# Patient Record
Sex: Male | Born: 1950 | Race: Black or African American | Hispanic: No | Smoking: Never smoker
Health system: Southern US, Community
[De-identification: ages and names within clinical notes are randomized; demographics above are authoritative.]

## PROBLEM LIST (undated history)

## (undated) DIAGNOSIS — E119 Type 2 diabetes mellitus without complications: Secondary | ICD-10-CM

## (undated) DIAGNOSIS — C801 Malignant (primary) neoplasm, unspecified: Secondary | ICD-10-CM

---

## 2021-01-10 ENCOUNTER — Emergency Department (HOSPITAL_BASED_OUTPATIENT_CLINIC_OR_DEPARTMENT_OTHER)
Admission: EM | Admit: 2021-01-10 | Discharge: 2021-01-10 | Disposition: A | Discharge status: Home / Self Care | Payer: Medicare Other | Attending: Emergency Medicine | Admitting: Emergency Medicine

## 2021-01-10 ENCOUNTER — Encounter (HOSPITAL_BASED_OUTPATIENT_CLINIC_OR_DEPARTMENT_OTHER): Payer: Self-pay | Admitting: Urology

## 2021-01-10 ENCOUNTER — Emergency Department (HOSPITAL_BASED_OUTPATIENT_CLINIC_OR_DEPARTMENT_OTHER): Payer: Medicare Other

## 2021-01-10 ENCOUNTER — Other Ambulatory Visit: Payer: Self-pay

## 2021-01-10 DIAGNOSIS — I10 Essential (primary) hypertension: Secondary | ICD-10-CM | POA: Insufficient documentation

## 2021-01-10 DIAGNOSIS — R109 Unspecified abdominal pain: Secondary | ICD-10-CM | POA: Insufficient documentation

## 2021-01-10 DIAGNOSIS — E119 Type 2 diabetes mellitus without complications: Secondary | ICD-10-CM | POA: Diagnosis not present

## 2021-01-10 DIAGNOSIS — Z79899 Other long term (current) drug therapy: Secondary | ICD-10-CM | POA: Insufficient documentation

## 2021-01-10 DIAGNOSIS — R197 Diarrhea, unspecified: Secondary | ICD-10-CM | POA: Insufficient documentation

## 2021-01-10 DIAGNOSIS — H5711 Ocular pain, right eye: Secondary | ICD-10-CM | POA: Insufficient documentation

## 2021-01-10 DIAGNOSIS — J449 Chronic obstructive pulmonary disease, unspecified: Secondary | ICD-10-CM | POA: Insufficient documentation

## 2021-01-10 DIAGNOSIS — B029 Zoster without complications: Secondary | ICD-10-CM | POA: Diagnosis not present

## 2021-01-10 DIAGNOSIS — I480 Paroxysmal atrial fibrillation: Secondary | ICD-10-CM | POA: Diagnosis not present

## 2021-01-10 DIAGNOSIS — B0230 Zoster ocular disease, unspecified: Secondary | ICD-10-CM

## 2021-01-10 DIAGNOSIS — Z853 Personal history of malignant neoplasm of breast: Secondary | ICD-10-CM | POA: Diagnosis not present

## 2021-01-10 DIAGNOSIS — R55 Syncope and collapse: Secondary | ICD-10-CM | POA: Diagnosis present

## 2021-01-10 HISTORY — DX: Malignant (primary) neoplasm, unspecified: C80.1

## 2021-01-10 HISTORY — DX: Type 2 diabetes mellitus without complications: E11.9

## 2021-01-10 LAB — T4, FREE: Free T4: 0.82 ng/dL (ref 0.61–1.12)

## 2021-01-10 LAB — CBC WITH DIFFERENTIAL/PLATELET
Abs Immature Granulocytes: 0.03 10*3/uL (ref 0.00–0.07)
Basophils Absolute: 0 10*3/uL (ref 0.0–0.1)
Basophils Relative: 0 %
Eosinophils Absolute: 0 10*3/uL (ref 0.0–0.5)
Eosinophils Relative: 0 %
HCT: 40.9 % (ref 39.0–52.0)
Hemoglobin: 13.3 g/dL (ref 13.0–17.0)
Immature Granulocytes: 0 %
Lymphocytes Relative: 23 %
Lymphs Abs: 2.2 10*3/uL (ref 0.7–4.0)
MCH: 27.5 pg (ref 26.0–34.0)
MCHC: 32.5 g/dL (ref 30.0–36.0)
MCV: 84.7 fL (ref 80.0–100.0)
Monocytes Absolute: 0.2 10*3/uL (ref 0.1–1.0)
Monocytes Relative: 2 %
Neutro Abs: 6.8 10*3/uL (ref 1.7–7.7)
Neutrophils Relative %: 75 %
Platelets: 457 10*3/uL — ABNORMAL HIGH (ref 150–400)
RBC: 4.83 MIL/uL (ref 4.22–5.81)
RDW: 16.2 % — ABNORMAL HIGH (ref 11.5–15.5)
WBC: 9.2 10*3/uL (ref 4.0–10.5)
nRBC: 0.2 % (ref 0.0–0.2)

## 2021-01-10 LAB — COMPREHENSIVE METABOLIC PANEL
ALT: 17 U/L (ref 0–44)
AST: 20 U/L (ref 15–41)
Albumin: 3.5 g/dL (ref 3.5–5.0)
Alkaline Phosphatase: 66 U/L (ref 38–126)
Anion gap: 12 (ref 5–15)
BUN: 20 mg/dL (ref 8–23)
CO2: 16 mmol/L — ABNORMAL LOW (ref 22–32)
Calcium: 7.7 mg/dL — ABNORMAL LOW (ref 8.9–10.3)
Chloride: 115 mmol/L — ABNORMAL HIGH (ref 98–111)
Creatinine, Ser: 1.21 mg/dL (ref 0.61–1.24)
GFR, Estimated: >60 mL/min (ref >60)
Glucose, Bld: 179 mg/dL — ABNORMAL HIGH (ref 70–99)
Potassium: 3.6 mmol/L (ref 3.5–5.1)
Sodium: 143 mmol/L (ref 135–145)
Total Bilirubin: 1.1 mg/dL (ref 0.3–1.2)
Total Protein: 6.1 g/dL — ABNORMAL LOW (ref 6.5–8.1)

## 2021-01-10 LAB — LIPASE, BLOOD: Lipase: 32 U/L (ref 11–51)

## 2021-01-10 LAB — TSH: TSH: 0.523 u[IU]/mL (ref 0.350–4.500)

## 2021-01-10 LAB — TROPONIN I (HIGH SENSITIVITY)
Troponin I (High Sensitivity): 8 ng/L (ref <18)
Troponin I (High Sensitivity): 5 ng/L (ref <18)

## 2021-01-10 LAB — CBG MONITORING, ED: Glucose-Capillary: 180 mg/dL — ABNORMAL HIGH (ref 70–99)

## 2021-01-10 MED ORDER — IOHEXOL 350 MG/ML SOLN
100.0000 mL | Freq: Once | INTRAVENOUS | Status: AC | PRN
  Administered 2021-01-10: 100 mL via INTRAVENOUS

## 2021-01-10 MED ORDER — METOPROLOL TARTRATE 5 MG/5ML IV SOLN
5.0000 mg | Freq: Once | INTRAVENOUS | Status: AC
  Administered 2021-01-10: 5 mg via INTRAVENOUS

## 2021-01-10 MED ORDER — ERYTHROMYCIN 5 MG/GM OP OINT: TOPICAL_OINTMENT | OPHTHALMIC | 0 refills | Status: AC

## 2021-01-10 MED ORDER — FLUORESCEIN SODIUM 1 MG OP STRP
1.0000 | ORAL_STRIP | Freq: Once | OPHTHALMIC | Status: AC
  Administered 2021-01-10: 1 via OPHTHALMIC
  Filled 2021-01-10: qty 1

## 2021-01-10 MED ORDER — ERYTHROMYCIN 5 MG/GM OP OINT
TOPICAL_OINTMENT | Freq: Four times a day (QID) | OPHTHALMIC | Status: DC
  Administered 2021-01-10: 1 via OPHTHALMIC
  Filled 2021-01-10: qty 3.5

## 2021-01-10 MED ORDER — LACTATED RINGERS IV BOLUS
1000.0000 mL | Freq: Once | INTRAVENOUS | Status: AC
  Administered 2021-01-10: 1000 mL via INTRAVENOUS

## 2021-01-10 MED ORDER — TETRACAINE HCL 0.5 % OP SOLN
2.0000 [drp] | Freq: Once | OPHTHALMIC | Status: AC
  Administered 2021-01-10: 2 [drp] via OPHTHALMIC
  Filled 2021-01-10: qty 4

## 2021-01-10 MED ORDER — VALACYCLOVIR HCL 1 G PO TABS
1000.0000 mg | ORAL_TABLET | Freq: Three times a day (TID) | ORAL | 0 refills | Status: AC
Start: 2021-01-10 — End: 2021-01-20

## 2021-01-10 NOTE — ED Notes (Signed)
Patient transported to CT 

## 2021-01-10 NOTE — Discharge Instructions (Addendum)
Continue to take valacyclovir for another 10 days.  Continue to take eye ointment 4 times a day and administer as nursing staff showed you today.  Call your ophthalmologist as they will be trying to evaluate you on Monday.  Follow-up in our atrial fibrillation clinic with cardiology.  Continue to take your Cardizem.  If you are having worsening palpitations, chest pain or shortness of breath please return to the emergency department for evaluation.

## 2021-01-10 NOTE — ED Notes (Signed)
ED Provider at bedside. Dr. Billy Fischer

## 2021-01-10 NOTE — ED Triage Notes (Signed)
Pt states rash with blisters to right side scalp and eye for 2 weeks, rash and blisters healing, but states right eye irritated and draining.  No redness or swelling noted at this time.    Pt currently on Chemo, BP LOW at triage

## 2021-01-10 NOTE — ED Provider Notes (Signed)
Patient signed out to me at 4 PM.  Here for reevaluation of his known herpes zoster infection to his right eye.  He has been having some diarrhea today as well.  Had a vasovagal event after using the bathroom here but did not lose consciousness or hit his head.  He has had extensive work-up that is overall unremarkable.  He is feeling better after IV fluids.  Lab work shows no significant anemia or electrolyte abnormality.  Troponin negative x2.  Bicarb is 16 but likely in the setting of GI losses.  Anion gap is normal.  He is not having any nausea or vomiting currently.  He is undergoing chemotherapy for prostate cancer metastasis treatment.  He states he has a history of atrial fibrillation/atrial flutter and takes Cardizem.  He is not on a blood thinner due to bleeding risk.  He understands the risks and benefits of not taking a blood thinner.  He is still not interested in taking blood thinner.  We will have him follow-up in atrial fibrillation clinic.  Heart rate was mildly elevated in the low 100s with atrial fibrillation rate but he was given Lopressor with good improvement of heart rate in the 90s.  He is asymptomatic from the standpoint.  Referred him to atrial fibrillation clinic.  He understands return to the ED if he has worsening palpitations, chest pain, shortness of breath.  Overall suspect diarrhea likely secondary to side effect of his antiviral or could be a viral process.  CT scan shows no evidence of PE or infectious process within the chest of the abdomen.  Discharged in good condition.  He is provider did get in touch with his ophthalmologist who recommend that he start erythromycin eye ointment and will continue valacyclovir.  They will follow-up with him on Monday.  This chart was dictated using voice recognition software.  Despite best efforts to proofread,  errors can occur which can change the documentation meaning.    Lennice Sites, DO 01/10/21 1713

## 2021-01-10 NOTE — ED Notes (Addendum)
Pt taken to Room 7 to complete triage due to low BP and ?near syncope. EDPA Harris at bedside to assess during triage. Pt more alert upon arrival to room. CAO x 4 at present

## 2021-01-10 NOTE — ED Notes (Signed)
ED Provider at bedside. Dr. Curatolo 

## 2021-01-10 NOTE — ED Notes (Signed)
ED Provider at bedside. 

## 2021-01-10 NOTE — ED Triage Notes (Signed)
Pt states feeling weak since this morning

## 2021-01-10 NOTE — ED Provider Notes (Signed)
[facility] EMERGENCY DEPARTMENT Provider Note   CSN: [id number] Arrival date & time: 01/10/21  1316     History Chief Complaint  Patient presents with   Eye Problem   Weakness    Robert Hammond is a 70 y.o. male.  HPI     70yo male with history of prostate cancer with bone metastasis, anemia, COPD, left breast cancer, hypertension, hyperlipidemia, PVD, herpes zoster opthalmicus of right eye/shingles 11/4, who presents with concern for continued pain over site and continued eye pain, however this morning also noted 7 episodes of diarrhea and had a near syncopal episode after triage in the ED and was found to be in new atrial fibrillation with RVR.   7 episodes of diarrhea since waking up this AM, runny, not black or bloody Came intoday because shingles and right side of head aching, numb and tingling, see if he could get more medication, believes in eye. Saw eye doctor in ED and was given valacyclvir, still finishing it, took erythromycin ointment, but rubbed it on face instead and ran out. Went into the bathroom at triage, was going to have a BM but then felt lightheaded and almost passed out, nausea  Felt like this before a few days after starting chemo about 1.5 months ago.  No chest pain or dyspnea No urinary symptoms, cough, fever   Right eye aches, vision not blurred, feels ok at baseline Right eye pain getting worse     Past Medical History:  Diagnosis Date   Cancer ([place])    Diabetes mellitus without complication ([place])     There are no problems to display for this patient.   History reviewed. No pertinent surgical history.     History reviewed. No pertinent family history.  Social History   Tobacco Use   Smoking status: Never   Smokeless tobacco: Never  Substance Use Topics   Alcohol use: Never   Drug use: Never    Home Medications Prior to Admission medications   Medication Sig Start Date End Date Taking? Authorizing Provider   abiraterone acetate (ZYTIGA) 250 MG tablet Take by mouth. 03/09/16  Yes [provider]  amLODipine (NORVASC) 10 MG tablet Take by mouth. 05/08/10  Yes [provider]  atorvastatin (LIPITOR) 20 MG tablet Take by mouth. 11/22/10  Yes [provider]  diltiazem (CARDIZEM) 30 MG tablet Take by mouth. 03/24/12  Yes [provider]  erythromycin ophthalmic ointment Place a 1/2 inch ribbon of ointment into the lower eyelid four times a day. 01/10/21  Yes Gareth Morgan, MD  losartan (COZAAR) 25 MG tablet Take by mouth. 12/07/19  Yes [provider]  magnesium oxide (MAG-OX) 400 MG tablet TAKE 1 TABLET BY MOUTH TWICE DAILY FOR 30 DAYS 12/16/17  Yes [provider]  naproxen (NAPROSYN) 500 MG tablet Take 1 tablet by mouth 2 (two) times daily with a meal. 03/09/18  Yes [provider]  omeprazole (PRILOSEC) 20 MG capsule Take by mouth. 08/06/10  Yes [provider]  oxybutynin (DITROPAN-XL) 10 MG 24 hr tablet Take 1 tablet by mouth daily. 08/25/11  Yes [provider]  potassium chloride (KLOR-CON) 10 MEQ tablet Take 1 tablet by mouth daily. 02/12/12  Yes [provider]  predniSONE (DELTASONE) 5 MG tablet Take 1 tablet by mouth daily. 03/09/16  Yes [provider]  prochlorperazine (COMPAZINE) 10 MG tablet Take by mouth. 09/15/20  Yes [provider]  triamterene-hydrochlorothiazide (MAXZIDE-25) 37.5-25 MG tablet Take 1 tablet by mouth daily.  02/12/12  Yes [provider]  HYDROcodone-acetaminophen (NORCO) 10-325 MG tablet Take 1 tablet by mouth 2 (two) times daily. 11/24/20   [provider]  mirtazapine (REMERON) 15 MG tablet Take 15 mg by mouth at bedtime. 11/24/20   [provider]  sertraline (ZOLOFT) 25 MG tablet Take 25 mg by mouth daily. 11/17/20   [provider]  valACYclovir (VALTREX) 1000 MG tablet Take 1 tablet (1,000 mg total) by mouth 3 (three) times daily  for 10 days. 01/10/21 01/20/21  Gareth Morgan, MD    Allergies    Patient has no known allergies.  Review of Systems   Review of Systems  Constitutional:  Negative for fever.  HENT:  Negative for sore throat.   Eyes:  Positive for pain. Negative for visual disturbance.  Respiratory:  Negative for cough and shortness of breath.   Cardiovascular:  Negative for chest pain.  Gastrointestinal:  Positive for abdominal pain and diarrhea. Negative for nausea and vomiting.  Genitourinary:  Negative for difficulty urinating.  Musculoskeletal:  Negative for back pain and neck stiffness.  Skin:  Positive for rash.  Neurological:  Positive for light-headedness. Negative for syncope, speech difficulty, weakness, numbness and headaches.   Physical Exam Updated Vital Signs BP (!) 147/92 (BP Location: Left Arm)   Pulse (!) 105   Temp 98.3 F (36.8 C) (Oral)   Resp 15   Ht 5\' 7"  (1.702 m)   Wt 60.8 kg   SpO2 100%   BMI 20.99 kg/m   Physical Exam Vitals and nursing note reviewed.  Constitutional:      General: He is not in acute distress.    Appearance: He is well-developed. He is not diaphoretic.  HENT:     Head: Normocephalic and atraumatic.  Eyes:     Conjunctiva/sclera: Conjunctivae normal.     Comments: Iop 13 on right, mild conjunctival ingection, possible tiny area of fluorescein uptake with woods lamp seen, normal EOM, normal pupils  Cardiovascular:     Rate and Rhythm: Tachycardia present. Rhythm irregular.     Heart sounds: Normal heart sounds. No murmur heard.   No friction rub. No gallop.  Pulmonary:     Effort: Pulmonary effort is normal. No respiratory distress.     Breath sounds: Normal breath sounds. No wheezing or rales.  Abdominal:     General: There is no distension.     Palpations: Abdomen is soft.     Tenderness: There is no abdominal tenderness. There is no guarding.  Musculoskeletal:     Cervical back: Normal range of motion.  Skin:    General: Skin is  warm and dry.  Neurological:     Mental Status: He is alert and oriented to person, place, and time.    ED Results / Procedures / Treatments   Labs (all labs ordered are listed, but only abnormal results are displayed) Labs Reviewed  CBC WITH DIFFERENTIAL/PLATELET - Abnormal; Notable for the following components:      Result Value   RDW 16.2 (*)    Platelets 457 (*)    All other components within normal limits  COMPREHENSIVE METABOLIC PANEL - Abnormal; Notable for the following components:   Chloride 115 (*)    CO2 16 (*)    Glucose, Bld 179 (*)    Calcium 7.7 (*)    Total Protein 6.1 (*)    All other components within normal limits  CBG MONITORING, ED - Abnormal; Notable for the following components:   Glucose-Capillary  180 (*)    All other components within normal limits  LIPASE, BLOOD  TSH  T4, FREE  TROPONIN I (HIGH SENSITIVITY)  TROPONIN I (HIGH SENSITIVITY)    EKG EKG Interpretation  Date/Time:  Saturday January 10 2021 13:32:22 EST Ventricular Rate:  123 PR Interval:    QRS Duration: 90 QT Interval:  353 QTC Calculation: 505 R Axis:   5 Text Interpretation: Atrial fibrillation LVH with secondary repolarization abnormality Prolonged QT interval No previous ECGs available Confirmed by Gareth Morgan [phone]) on 01/10/2021 2:45:15 PM  Radiology CT Angio Chest PE W and/or Wo Contrast  Result Date: 01/10/2021 CLINICAL DATA:  Near syncope, atrial fibrillation EXAM: CT ANGIOGRAPHY CHEST WITH CONTRAST TECHNIQUE: Multidetector CT imaging of the chest was performed using the standard protocol during bolus administration of intravenous contrast. Multiplanar CT image reconstructions and MIPs were obtained to evaluate the vascular anatomy. CONTRAST:  121mL OMNIPAQUE IOHEXOL 350 MG/ML SOLN COMPARISON:  None. FINDINGS: Cardiovascular: Heart is enlarged in size. There is homogeneous enhancement in the thoracic aorta. Ascending thoracic aorta measures 4 cm. There are no  intraluminal filling defects in pulmonary artery branches. There is ectasia of main pulmonary artery measuring 3.8 cm. Mediastinum/Nodes: No significant lymphadenopathy seen Lungs/Pleura: There is no focal pulmonary consolidation. There is no pleural effusion or pneumothorax. Upper Abdomen: There is bilateral hydronephrosis. Bilateral ureteral stents are seen. Musculoskeletal: Is deformity in inferior aspect of manubrium sternum. There is slight decrease in height of few thoracic vertebral bodies. There is decrease in height of upper endplate of body of T2 vertebra. There is mild central compression in the bodies of T6, T7, T8, T9 and T10 vertebrae. Degenerative changes are noted in the visualized lower cervical spine with bony spurs. Review of the MIP images confirms the above findings. IMPRESSION: There is no evidence of pulmonary artery embolism. There is no evidence of thoracic aortic dissection. There is no focal pulmonary consolidation. There is ectasia of main pulmonary artery suggesting possible pulmonary arterial hypertension. There is ectasia of ascending thoracic aorta. There is deformity in the inferior aspect of manubrium sternum suggesting recent or old fracture. There is decrease in height of multiple thoracic vertebral bodies suggesting recent or old fractures. Electronically Signed   By: Elmer Picker M.D.   On: 01/10/2021 16:42   CT ABDOMEN PELVIS W CONTRAST  Result Date: 01/10/2021 CLINICAL DATA:  Abdominal pain, diarrhea, metastatic prostate cancer, history of left breast cancer EXAM: CT ABDOMEN AND PELVIS WITH CONTRAST TECHNIQUE: Multidetector CT imaging of the abdomen and pelvis was performed using the standard protocol following bolus administration of intravenous contrast. CONTRAST:  147mL OMNIPAQUE IOHEXOL 350 MG/ML SOLN COMPARISON:  None. FINDINGS: Lower chest: No acute pleural or parenchymal lung disease. Hepatobiliary: No focal liver abnormality is seen. No gallstones,  gallbladder wall thickening, or biliary dilatation. Pancreas: Unremarkable. No pancreatic ductal dilatation or surrounding inflammatory changes. Spleen: Normal in size without focal abnormality. Adrenals/Urinary Tract: Bilateral ureteral stents are identified extending from the renal pelves to the bladder lumen. There is mild bilateral hydronephrosis and hydroureter. There is bilateral renal cortical scarring and thinning. Small renal cortical cysts are also noted. Mild bilateral ureteral wall thickening. The bladder is decompressed, with nonspecific bladder wall thickening. The adrenals are unremarkable. Stomach/Bowel: No bowel obstruction or ileus. Normal appendix right lower quadrant. No bowel wall thickening or inflammatory change. Vascular/Lymphatic: Mild atherosclerosis of the aorta and its distal branches. There is chronic occlusion of the right external iliac artery. A femoral-femoral bypass graft is seen within  the lower anterior abdominal wall, which is occluded. The right common femoral artery reconstitutes at the level of the anastomosis with the occluded femoral-femoral bypass graft, likely due to internal iliac collaterals. 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 The left superficial and profundus femoral arteries are patent. No pathologic adenopathy within the abdomen or pelvis. Reproductive: The prostate appears surgically absent. Other: No free fluid or free gas.  No abdominal wall hernia. Musculoskeletal: Mixed lytic and sclerotic focus within the left iliac crest consistent with metastatic disease. Primarily sclerotic lesions within the left inferior pubic ramus, right L1 pedicle and left posterior ninth rib also compatible with bony metastases. If further evaluation is desired, bone scan could be considered. There are no acute displaced fractures. Reconstructed images demonstrate no  additional findings. IMPRESSION: 1. No acute intra-abdominal or intrapelvic process to account for the patient's complaint of abdominal pain and diarrhea. 2. Bony metastases within the thoracic cage, lumbar spine, and pelvis consistent with metastatic prostate cancer per history. 3. Bilateral hydronephrosis and hydroureter, with indwelling ureteral stents as above. 4. 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 Occluded femoral-femoral bypass graft as above. 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 5.  Aortic Atherosclerosis (ICD10-I70.0). Electronically Signed   By: Randa Ngo M.D.   On: 01/10/2021 16:45    Procedures Procedures   Medications Ordered in ED Medications  fluorescein ophthalmic strip 1 strip (1 strip Both Eyes Given by Other 01/10/21 1431)  tetracaine (PONTOCAINE) 0.5 % ophthalmic solution 2 drop (2 drops Both Eyes Given by Other 01/10/21 1430)  lactated ringers bolus 1,000 mL (0 mLs Intravenous Stopped 01/10/21 1605)  metoprolol tartrate (LOPRESSOR) injection 5 mg (5 mg Intravenous Given 01/10/21 1610)  iohexol (OMNIPAQUE) 350 MG/ML injection 100 mL (100 mLs Intravenous Contrast Given 01/10/21 1535)    ED Course  I have reviewed the triage vital signs and the nursing notes.  Pertinent labs & imaging results that were available during my care of the patient were reviewed by me and considered in my medical decision making (see chart for details). 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  Eye pain/facial pain--possible post-herpetic neuralgia, versus continued herpes zoster. Denies visual changes. Normal IOP/possible tiny area of fluorescein uptake. Discussed with Op[facility]o at [facility] especially given he had not done drops/erythromycin as prescribed previously.  Recommend erythromycin as well as valacyclovir treatment again and follow up in their office. Near-Syncope--no sign of significant anemia, do not suspect GI bleed. Possible dehydration related with many episodes of diarrhea today, labs show nonaniongap metabolic acidosis consistent with this, given IV fluid.   Suspect most likely vagal incident given at time was about to have BM, quickly recovered blood pressures.  Consider PE however given new onset atrial fibrillation and cancer history or other intraabdominal infection. Lower suspicion for cardiac arrythmia other than atrial fibrillation at this time--offered admission to patient for near-syncope observation but he would prefer to go home and would like to reassess after other results back New onset atrial fibrillation-sent TSH, PE study pending. Reports he has had significant hematuria in the past and obstruction and would rather risk stroke than be initiated on anticoagulation given problems with this before. He is on diltiazem and has cardiologist associated with [facility] in [place]. Given IV metoprolol given fluctuating HR in ED up to 120s.   Signed out to Dr. Ronnald Nian with PE study pending to evaluate new onset atrial fibrillation and near-syncope in high risk patient with cancer, and CT abdomen pelvis pending given diarrhea, abdominal tenderness on exam.   Final Clinical Impression(s) / ED Diagnoses Final diagnoses:  Ophthalmic herpes zoster  Paroxysmal atrial fibrillation ([place])    Rx / DC Orders ED Discharge Orders           Ordered    erythromycin ophthalmic ointment        01/10/21 1532    valACYclovir (VALTREX) 1000 MG tablet  3 times daily        01/10/21 1534    Amb Referral to AFIB Clinic        01/10/21 [place], [place], MD 01/11/21 [phone]

## 2021-01-12 ENCOUNTER — Telehealth (HOSPITAL_COMMUNITY): Payer: Self-pay

## 2021-01-12 NOTE — Telephone Encounter (Signed)
Reached out to patient to schedule ED follow-up. Tried to leave voicemail mailbox full.

## 2021-12-14 ENCOUNTER — Other Ambulatory Visit (HOSPITAL_COMMUNITY): Payer: Self-pay | Admitting: Hematology & Oncology

## 2021-12-14 DIAGNOSIS — C7951 Secondary malignant neoplasm of bone: Secondary | ICD-10-CM

## 2021-12-21 ENCOUNTER — Ambulatory Visit (HOSPITAL_COMMUNITY): Admission: RE | Admit: 2021-12-21 | Payer: Medicare Other | Source: Ambulatory Visit

## 2021-12-31 ENCOUNTER — Encounter (HOSPITAL_COMMUNITY): Payer: Self-pay

## 2021-12-31 ENCOUNTER — Ambulatory Visit (HOSPITAL_COMMUNITY)
Admission: RE | Admit: 2021-12-31 | Discharge: 2021-12-31 | Disposition: A | Discharge status: Home / Self Care | Payer: Medicare Other | Source: Ambulatory Visit | Attending: Hematology & Oncology | Admitting: Hematology & Oncology

## 2021-12-31 DIAGNOSIS — C7951 Secondary malignant neoplasm of bone: Secondary | ICD-10-CM

## 2022-01-07 ENCOUNTER — Other Ambulatory Visit (HOSPITAL_COMMUNITY): Payer: Self-pay | Admitting: Hematology & Oncology

## 2022-01-07 DIAGNOSIS — C61 Malignant neoplasm of prostate: Secondary | ICD-10-CM

## 2022-01-11 ENCOUNTER — Other Ambulatory Visit: Payer: Self-pay | Admitting: *Deleted

## 2022-01-11 DIAGNOSIS — C61 Malignant neoplasm of prostate: Secondary | ICD-10-CM

## 2022-01-19 ENCOUNTER — Telehealth: Payer: Self-pay | Admitting: Oncology

## 2022-01-19 NOTE — Telephone Encounter (Signed)
Per  staff message called and spoke to pt about appointment

## 2022-01-25 ENCOUNTER — Inpatient Hospital Stay: Payer: Medicare Other | Attending: Internal Medicine

## 2022-02-01 ENCOUNTER — Encounter (HOSPITAL_COMMUNITY)
Admission: RE | Admit: 2022-02-01 | Discharge: 2022-02-01 | Disposition: A | Discharge status: Home / Self Care | Payer: Medicare Other | Source: Ambulatory Visit | Attending: Hematology & Oncology | Admitting: Hematology & Oncology

## 2022-02-01 VITALS — BP 128/85 | HR 76 | Resp 18

## 2022-02-01 DIAGNOSIS — C61 Malignant neoplasm of prostate: Secondary | ICD-10-CM | POA: Diagnosis present

## 2022-02-01 LAB — BASIC METABOLIC PANEL
Anion gap: 8 (ref 5–15)
BUN: 13 mg/dL (ref 8–23)
CO2: 21 mmol/L — ABNORMAL LOW (ref 22–32)
Calcium: 8.4 mg/dL — ABNORMAL LOW (ref 8.9–10.3)
Chloride: 112 mmol/L — ABNORMAL HIGH (ref 98–111)
Creatinine, Ser: 1.37 mg/dL — ABNORMAL HIGH (ref 0.61–1.24)
GFR, Estimated: 55 mL/min — ABNORMAL LOW (ref >60)
Glucose, Bld: 128 mg/dL — ABNORMAL HIGH (ref 70–99)
Potassium: 4.6 mmol/L (ref 3.5–5.1)
Sodium: 141 mmol/L (ref 135–145)

## 2022-02-01 MED ORDER — SODIUM CHLORIDE 0.9 % IV BOLUS: 1000.0000 mL | Freq: Once | INTRAVENOUS | Status: DC

## 2022-02-01 MED ORDER — LUTETIUM LU 177 VIPIVOTIDE TET 1000 MBQ/ML IV SOLN
200.0000 | Freq: Once | INTRAVENOUS | Status: AC
  Administered 2022-02-01: 202.79 via INTRAVENOUS

## 2022-02-01 NOTE — Progress Notes (Signed)
CLINICAL DATA: [Castrate resistant metastatic prostate carcinoma.  Patient status post chemotherapy with increasing PSA.  Bilateral renal stents.]  EXAM: NUCLEAR MEDICINE PLUVICTO INJECTION  TECHNIQUE: Infusion: The nuclear medicine technologist and I personally verified the dose activity to be delivered as specified in the written directive, and verified the patient identification via 2 separate methods.  Initial flush of the intravenous catheter was performed was sterile saline. The dose syringe was connected to the catheter and the Lu-177 Pluvicto administered over a 1 to 10 min infusion. Single 10 cc  lushes with normal saline follow the dose. No complications were noted. The entire IV tubing, venocatheter, stopcock and syringes was removed in total, placed in a disposal bag and sent for assay of the residual activity, which will be reported at a later time in our EMR by the physics staff. Pressure was applied to the venipuncture site, and a compression bandage placed. Patient monitored for 1 hour following infusion.    Radiation Safety personnel were present to perform the discharge survey, as detailed on their documentation. After a short period of observation, the patient had his IV removed.  RADIOPHARMACEUTICALS: [202.8] microcuries Lu-177 PLUVICTO  FINDINGS: Current Infusion: [1]  Planned Infusions: 6    Patient presented to nuclear medicine for treatment. The patient's most recent blood counts were reviewed and remains a good candidate to proceed with Lu-177 Pluvicto.     Patient has bilateral ureteral stents.  Creatinine in within acceptable level (serum creatinine equal 1.37).  GFR equal 55.      Hemoglobin mildly depressed related to chemotherapy.       Risk and benefits of procedure described to the patient.  Primary risk being myelosuppression and renal toxicity.  Primary benefit being progression-free survival.     The patient was situated in an infusion suite  with a contact barrier placed under the arm. Intravenous access was established, using sterile technique, and a normal saline infusion from a syringe was started.     Micro-dosimetry: The prescribed radiation activity was assayed and confirmed to be within specified tolerance.  IMPRESSION: Current Infusion: [1]  Planned Infusions: 6    [The patient tolerated the infusion well. The patient will return in 6 weeks for for ongoing care.]    Patient to return to oncologist in 5 weeks for CBC and BMP.

## 2022-02-01 NOTE — Progress Notes (Signed)
Treatment is complete. Pt is alert, calm, and pleasant and has no s/s of distress. VS and orders assessed and pt denies additional complaints. Will continue to monitor and tx pt according to MD orders.

## 2022-02-01 NOTE — Written Directive (Addendum)
  PLUVICTO  THERAPY   RADIOPHARMACEUTICAL: Lutetium 177 vipivotide tetraxetan (Pluvicto)     PRESCRIBED DOSE FOR ADMINISTRATION:  200 mCi   ROUTE OFADMINISTRATION:  IV   DIAGNOSIS:  Prostate Cancer    REFERRING PHYSICIAN: Dr. Cruzita Lederer   TREATMENT #: 1   ADDITIONAL PHYSICIAN COMMENTS/NOTES:   AUTHORIZED USER SIGNATURE & TIME STAMP: Rennis Golden, MD   02/01/22    9:01 AM

## 2022-03-08 ENCOUNTER — Inpatient Hospital Stay: Payer: Medicaid Other | Attending: Internal Medicine

## 2022-03-12 NOTE — Written Directive (Signed)
  PLUVICTO  THERAPY   RADIOPHARMACEUTICAL: Lutetium 177 vipivotide tetraxetan (Pluvicto)     PRESCRIBED DOSE FOR ADMINISTRATION:  200 mCi   ROUTE OFADMINISTRATION:  IV   DIAGNOSIS:  Prostate Cancer    REFERRING PHYSICIAN: Dr Cruzita Lederer    TREATMENT #: 2    ADDITIONAL PHYSICIAN COMMENTS/NOTES:   AUTHORIZED USER SIGNATURE & TIME STAMP:

## 2022-03-15 ENCOUNTER — Ambulatory Visit (HOSPITAL_COMMUNITY)
Admission: RE | Admit: 2022-03-15 | Discharge: 2022-03-15 | Disposition: A | Discharge status: Home / Self Care | Payer: 59 | Source: Ambulatory Visit | Attending: Hematology & Oncology | Admitting: Hematology & Oncology

## 2022-03-15 DIAGNOSIS — C61 Malignant neoplasm of prostate: Secondary | ICD-10-CM | POA: Insufficient documentation

## 2022-03-15 MED ORDER — SODIUM CHLORIDE 0.9 % IV BOLUS: 1000.0000 mL | Freq: Once | INTRAVENOUS | Status: DC

## 2022-03-15 MED ORDER — LUTETIUM LU 177 VIPIVOTIDE TET 1000 MBQ/ML IV SOLN
200.0000 | Freq: Once | INTRAVENOUS | Status: AC
  Administered 2022-03-15: 209.9 via INTRAVENOUS

## 2022-03-15 NOTE — Written Directive (Signed)
MOLECULAR IMAGING AND THERAPEUTICS WRITTEN DIRECTIVE   PATIENT NAME: Robert Hammond  PT DOB:   October 03, 1950                                              MRN: [id number]  ---------------------------------------------------------------------------------------------------------------------   PLUVICTO  THERAPY   RADIOPHARMACEUTICAL: Lutetium 177 vipivotide tetraxetan (Pluvicto)     PRESCRIBED DOSE FOR ADMINISTRATION:  200 mCi   ROUTE OFADMINISTRATION:  IV   DIAGNOSIS:  Prostate Cancer   REFERRING PHYSICIAN: V.C. Harish   TREATMENT #: 2   ADDITIONAL PHYSICIAN COMMENTS/NOTES:   AUTHORIZED USER SIGNATURE & TIME STAMP:

## 2022-03-15 NOTE — Progress Notes (Signed)
Pluvicto treatment is complete and pt is alert, calm and pleasant and has no s/s of distress. VS and orders assessed and pt denies complaints. Will continue to monitor and tx pt according to MD orders. NS continues to infuse at this time.

## 2022-03-15 NOTE — Progress Notes (Signed)
CLINICAL DATA: [ Castrate resistant metastatic prostate carcinoma. Patient status post chemotherapy with increasing PSA. Bilateral renal stents. ]  EXAM: NUCLEAR MEDICINE PLUVICTO INJECTION  TECHNIQUE: Infusion: The nuclear medicine technologist and I personally verified the dose activity to be delivered as specified in the written directive, and verified the patient identification via 2 separate methods.  Initial flush of the intravenous catheter was performed was sterile saline. The dose syringe was connected to the catheter and the Lu-177 Pluvicto administered over a 1 to 10 min infusion. Single 10 cc  lushes with normal saline follow the dose. No complications were noted. The entire IV tubing, venocatheter, stopcock and syringes was removed in total, placed in a disposal bag and sent for assay of the residual activity, which will be reported at a later time in our EMR by the physics staff. Pressure was applied to the venipuncture site, and a compression bandage placed. Patient monitored for 1 hour following infusion.  Radiation Safety personnel were present to perform the discharge survey, as detailed on their documentation. After a short period of observation, the patient had his IV removed.  RADIOPHARMACEUTICALS: [209.9] microcuries Lu-177 PLUVICTO  FINDINGS: Current Infusion: [2]  Planned Infusions: 6  Patient presented to nuclear medicine for treatment. The patient's most recent blood counts were reviewed and remains a good candidate to proceed with Lu-177 Pluvicto.   Normal renal function hepatic function.  No significant decrease in blood counts.    Patient reports significant decrease in PSA (lab value  not available)    The patient was situated in an infusion suite with a contact barrier placed under the arm. Intravenous access was established, using sterile technique, and a normal saline infusion from a syringe was started.    Micro-dosimetry: The prescribed radiation  activity was assayed and confirmed to be within specified tolerance.  IMPRESSION: Current Infusion: [2]  Planned Infusions: 6  [The patient tolerated the infusion well. The patient will return in 6 weeks for ongoing care.]

## 2022-04-19 ENCOUNTER — Inpatient Hospital Stay: Payer: 59

## 2022-04-22 ENCOUNTER — Inpatient Hospital Stay: Payer: 59

## 2022-04-22 ENCOUNTER — Inpatient Hospital Stay: Payer: 59 | Attending: Internal Medicine

## 2022-04-22 ENCOUNTER — Other Ambulatory Visit: Payer: Self-pay

## 2022-04-22 DIAGNOSIS — Z79899 Other long term (current) drug therapy: Secondary | ICD-10-CM | POA: Diagnosis not present

## 2022-04-22 DIAGNOSIS — C61 Malignant neoplasm of prostate: Secondary | ICD-10-CM | POA: Insufficient documentation

## 2022-04-22 DIAGNOSIS — C7951 Secondary malignant neoplasm of bone: Secondary | ICD-10-CM | POA: Diagnosis not present

## 2022-04-22 LAB — CMP (CANCER CENTER ONLY)
ALT: 8 U/L (ref 0–44)
AST: 13 U/L — ABNORMAL LOW (ref 15–41)
Albumin: 3.7 g/dL (ref 3.5–5.0)
Alkaline Phosphatase: 76 U/L (ref 38–126)
Anion gap: 8 (ref 5–15)
BUN: 11 mg/dL (ref 8–23)
CO2: 23 mmol/L (ref 22–32)
Calcium: 8.3 mg/dL — ABNORMAL LOW (ref 8.9–10.3)
Chloride: 113 mmol/L — ABNORMAL HIGH (ref 98–111)
Creatinine: 0.97 mg/dL (ref 0.61–1.24)
GFR, Estimated: >60 mL/min (ref >60)
Glucose, Bld: 96 mg/dL (ref 70–99)
Potassium: 3.4 mmol/L — ABNORMAL LOW (ref 3.5–5.1)
Sodium: 144 mmol/L (ref 135–145)
Total Bilirubin: 0.3 mg/dL (ref 0.3–1.2)
Total Protein: 6.1 g/dL — ABNORMAL LOW (ref 6.5–8.1)

## 2022-04-22 LAB — CBC WITH DIFFERENTIAL (CANCER CENTER ONLY)
Abs Immature Granulocytes: 0 10*3/uL (ref 0.00–0.07)
Basophils Absolute: 0 10*3/uL (ref 0.0–0.1)
Basophils Relative: 1 %
Eosinophils Absolute: 0.1 10*3/uL (ref 0.0–0.5)
Eosinophils Relative: 4 %
HCT: 39.9 % (ref 39.0–52.0)
Hemoglobin: 12.9 g/dL — ABNORMAL LOW (ref 13.0–17.0)
Immature Granulocytes: 0 %
Lymphocytes Relative: 43 %
Lymphs Abs: 1.3 10*3/uL (ref 0.7–4.0)
MCH: 25.9 pg — ABNORMAL LOW (ref 26.0–34.0)
MCHC: 32.3 g/dL (ref 30.0–36.0)
MCV: 80.1 fL (ref 80.0–100.0)
Monocytes Absolute: 0.5 10*3/uL (ref 0.1–1.0)
Monocytes Relative: 17 %
Neutro Abs: 1.1 10*3/uL — ABNORMAL LOW (ref 1.7–7.7)
Neutrophils Relative %: 35 %
Platelet Count: 243 10*3/uL (ref 150–400)
RBC: 4.98 MIL/uL (ref 4.22–5.81)
RDW: 17.2 % — ABNORMAL HIGH (ref 11.5–15.5)
WBC Count: 3 10*3/uL — ABNORMAL LOW (ref 4.0–10.5)
nRBC: 0 % (ref 0.0–0.2)

## 2022-04-22 MED ORDER — SODIUM CHLORIDE 0.9% FLUSH
10.0000 mL | INTRAVENOUS | Status: AC | PRN
  Administered 2022-04-22: 10 mL

## 2022-04-22 MED ORDER — HEPARIN SOD (PORK) LOCK FLUSH 100 UNIT/ML IV SOLN
500.0000 [IU] | INTRAVENOUS | Status: AC | PRN
  Administered 2022-04-22: 500 [IU]

## 2022-04-23 ENCOUNTER — Inpatient Hospital Stay: Payer: 59

## 2022-04-24 LAB — PROSTATE-SPECIFIC AG, SERUM (LABCORP): Prostate Specific Ag, Serum: 16.4 ng/mL — ABNORMAL HIGH (ref 0.0–4.0)

## 2022-04-26 ENCOUNTER — Other Ambulatory Visit (HOSPITAL_COMMUNITY): Payer: Medicare Other

## 2022-04-28 NOTE — Written Directive (Addendum)
MOLECULAR IMAGING AND THERAPEUTICS WRITTEN DIRECTIVE   PATIENT NAME: Robert Hammond  PT DOB:   [birth year]                                              MRN: [id number]  ---------------------------------------------------------------------------------------------------------------------   PLUVICTO  THERAPY   RADIOPHARMACEUTICAL: Lutetium 177 vipivotide tetraxetan (Pluvicto)     PRESCRIBED DOSE FOR ADMINISTRATION:  200 mCi   ROUTE OFADMINISTRATION:  IV   DIAGNOSIS:  Prostate cancer   REFERRING PHYSICIAN: Dr. Ardeth Sportsman   TREATMENT #: 3   ADDITIONAL PHYSICIAN COMMENTS/NOTES:   AUTHORIZED USER SIGNATURE & TIME STAMP: Rennis Golden, MD   04/28/22    8:24 PM

## 2022-04-29 ENCOUNTER — Encounter (HOSPITAL_COMMUNITY)
Admission: RE | Admit: 2022-04-29 | Discharge: 2022-04-29 | Disposition: A | Discharge status: Home / Self Care | Payer: 59 | Source: Ambulatory Visit | Attending: Hematology & Oncology | Admitting: Hematology & Oncology

## 2022-04-29 DIAGNOSIS — C61 Malignant neoplasm of prostate: Secondary | ICD-10-CM | POA: Insufficient documentation

## 2022-04-29 MED ORDER — SODIUM CHLORIDE 0.9 % IV SOLN: INTRAVENOUS | Status: AC

## 2022-04-30 ENCOUNTER — Ambulatory Visit (HOSPITAL_COMMUNITY)
Admission: RE | Admit: 2022-04-30 | Discharge: 2022-04-30 | Disposition: A | Discharge status: Home / Self Care | Payer: 59 | Source: Ambulatory Visit | Attending: Hematology & Oncology | Admitting: Hematology & Oncology

## 2022-04-30 DIAGNOSIS — C61 Malignant neoplasm of prostate: Secondary | ICD-10-CM | POA: Diagnosis present

## 2022-04-30 MED ORDER — SODIUM CHLORIDE 0.9 % IV BOLUS: 1000.0000 mL | Freq: Once | INTRAVENOUS | Status: DC

## 2022-04-30 MED ORDER — LUTETIUM LU 177 VIPIVOTIDE TET 1000 MBQ/ML IV SOLN
184.8000 | Freq: Once | INTRAVENOUS | Status: AC
  Administered 2022-04-30: 184.8 via INTRAVENOUS

## 2022-04-30 NOTE — Progress Notes (Signed)
CLINICAL DATA:  Castrate resistant metastatic prostate carcinoma. Patient status post chemotherapy with increasing PSA. Bilateral renal stents.  EXAM: NUCLEAR MEDICINE PLUVICTO INJECTION  TECHNIQUE: Infusion: The nuclear medicine technologist and I personally verified the dose activity to be delivered as specified in the written directive, and verified the patient identification via 2 separate methods.  Initial flush of the intravenous catheter was performed was sterile saline. The dose syringe was connected to the catheter and the Lu-177 Pluvicto administered over a 1 to 10 min infusion. Single 10 cc  lushes with normal saline follow the dose. No complications were noted. The entire IV tubing, venocatheter, stopcock and syringes was removed in total, placed in a disposal bag and sent for assay of the residual activity, which will be reported at a later time in our EMR by the physics staff. Pressure was applied to the venipuncture site, and a compression bandage placed. Patient monitored for 1 hour following infusion.    Radiation Safety personnel were present to perform the discharge survey, as detailed on their documentation. After a short period of observation, the patient had his IV removed.  RADIOPHARMACEUTICALS: [184.4] microcuries Lu-177 PLUVICTO  FINDINGS: Current Infusion: [3]  Planned Infusions: 6   Patient presented to nuclear medicine for treatment. The patient's most recent blood counts were reviewed and remains a adequate candidate to proceed with Lu-177 Pluvicto.  Patient reports no interval adverse effects.    Patient's PSA equal 16.4  decreased from 151 four  months ago.    Patient's white blood cell count mildly depressed at 3,000 with absolute neutrophil count equal 1,100.   Renal function and liver function remain normal.    The patient was situated in an infusion suite with a contact barrier placed under the arm. Intravenous access was established, using  sterile technique, and a normal saline infusion from a syringe was started.     Micro-dosimetry: The prescribed radiation activity was assayed and confirmed to be within specified tolerance.  IMPRESSION: Current Infusion: [3]  Planned Infusions: 6  [The patient tolerated the infusion well. The patient will return in 6 weeks for ongoing care.]

## 2022-05-31 ENCOUNTER — Inpatient Hospital Stay: Payer: Medicaid Other | Attending: Internal Medicine

## 2022-06-03 NOTE — Written Directive (Addendum)
MOLECULAR IMAGING AND THERAPEUTICS WRITTEN DIRECTIVE   PATIENT NAME: Robert Hammond  PT DOB:   1951/02/10                                              MRN: [id number]  ---------------------------------------------------------------------------------------------------------------------   PLUVICTO  THERAPY   RADIOPHARMACEUTICAL: Lutetium 177 vipivotide tetraxetan (Pluvicto)     PRESCRIBED DOSE FOR ADMINISTRATION:  200 mCi   ROUTE OFADMINISTRATION:  IV   DIAGNOSIS:  Prostate Cancer   REFERRING PHYSICIAN: Dr. Lovena Le. Harish   TREATMENT #: 4   ADDITIONAL PHYSICIAN COMMENTS/NOTES:   AUTHORIZED USER SIGNATURE & TIME STAMP: Patriciaann Clan, MD   06/07/22    9:22 AM

## 2022-06-07 ENCOUNTER — Ambulatory Visit (HOSPITAL_COMMUNITY)
Admission: RE | Admit: 2022-06-07 | Discharge: 2022-06-07 | Disposition: A | Discharge status: Home / Self Care | Payer: 59 | Source: Ambulatory Visit | Attending: Hematology & Oncology | Admitting: Hematology & Oncology

## 2022-06-07 DIAGNOSIS — C61 Malignant neoplasm of prostate: Secondary | ICD-10-CM | POA: Insufficient documentation

## 2022-06-07 LAB — BASIC METABOLIC PANEL
Anion gap: 18 — ABNORMAL HIGH (ref 5–15)
BUN: 20 mg/dL (ref 8–23)
CO2: 21 mmol/L — ABNORMAL LOW (ref 22–32)
Calcium: 8.9 mg/dL (ref 8.9–10.3)
Chloride: 102 mmol/L (ref 98–111)
Creatinine, Ser: 1.66 mg/dL — ABNORMAL HIGH (ref 0.61–1.24)
GFR, Estimated: 44 mL/min — ABNORMAL LOW (ref >60)
Glucose, Bld: 125 mg/dL — ABNORMAL HIGH (ref 70–99)
Potassium: 4.7 mmol/L (ref 3.5–5.1)
Sodium: 141 mmol/L (ref 135–145)

## 2022-06-07 LAB — PSA: Prostatic Specific Antigen: 12.32 ng/mL — ABNORMAL HIGH (ref 0.00–4.00)

## 2022-06-07 MED ORDER — SODIUM CHLORIDE 0.9 % IV SOLN: INTRAVENOUS | Status: AC

## 2022-06-07 MED ORDER — LUTETIUM LU 177 VIPIVOTIDE TET 1000 MBQ/ML IV SOLN
198.5000 | Freq: Once | INTRAVENOUS | Status: AC
  Administered 2022-06-07: 198.5 via INTRAVENOUS

## 2022-06-07 MED ORDER — SODIUM CHLORIDE 0.9 % IV BOLUS: 1000.0000 mL | Freq: Once | INTRAVENOUS | Status: DC

## 2022-06-07 NOTE — Progress Notes (Signed)
Tolerated Treatment well

## 2022-06-07 NOTE — Progress Notes (Signed)
CLINICAL DATA:  Castrate resistant metastatic prostate carcinoma. Patient status post chemotherapy with increasing PSA. Bilateral renal stents.  EXAM: NUCLEAR MEDICINE PLUVICTO INJECTION  TECHNIQUE: Infusion: The nuclear medicine technologist and I personally verified the dose activity to be delivered as specified in the written directive, and verified the patient identification via 2 separate methods.  Initial flush of the intravenous catheter was performed was sterile saline. The dose syringe was connected to the catheter and the Lu-177 Pluvicto administered over a 1 to 10 min infusion. Single 10 cc  lushes with normal saline follow the dose. No complications were noted. The entire IV tubing, venocatheter, stopcock and syringes was removed in total, placed in a disposal bag and sent for assay of the residual activity, which will be reported at a later time in our EMR by the physics staff. Pressure was applied to the venipuncture site, and a compression bandage placed. Patient monitored for 1 hour following infusion.    Radiation Safety personnel were present to perform the discharge survey, as detailed on their documentation. After a short period of observation, the patient had his IV removed.  RADIOPHARMACEUTICALS: [198.5] microcuries Lu-177 PLUVICTO  FINDINGS: Current Infusion: [4   Planned Infusions: 6    Patient presented to nuclear medicine for treatment.  Patient reports no adverse effects.  The patient's most recent blood counts were reviewed and remains a good candidate to proceed with Lu-177 Pluvicto.   Patient renal function has declined with creatinine equal 1.66.  Patient advised to hydrate following therapy.  Patient received 0.5 L normal saline during treatment.    PSA remained decreased at 12.6.    Unfortunately, patient  CBC clotted and could not be be measured.  Recommend follow-up CBC with oncologist at planned near future [facility] treatement.    The patient  was situated in an infusion suite with a contact barrier placed under the arm. Intravenous access was established, using sterile technique, and a normal saline infusion from a syringe was started.     Micro-dosimetry: The prescribed radiation activity was assayed and confirmed to be within specified tolerance.  IMPRESSION: Current Infusion: [4]  Planned Infusions: 6    [The patient tolerated the infusion well. The patient will return in 6 weeks for ongoing care.]

## 2022-07-09 NOTE — Written Directive (Addendum)
  PLUVICTO  THERAPY   RADIOPHARMACEUTICAL: Lutetium 177 vipivotide tetraxetan (Pluvicto)     PRESCRIBED DOSE FOR ADMINISTRATION:  200 mCi   ROUTE OFADMINISTRATION:  IV   DIAGNOSIS:  PROSTATE CANCER   REFERRING PHYSICIAN:V.C. HARISH   TREATMENT #: 5   ADDITIONAL PHYSICIAN COMMENTS/NOTES:    AUTHORIZED USER SIGNATURE & TIME STAMP: Patriciaann Clan, MD   07/21/22    2:00 PM

## 2022-07-12 ENCOUNTER — Inpatient Hospital Stay: Payer: Medicaid Other | Attending: Internal Medicine

## 2022-07-21 ENCOUNTER — Ambulatory Visit (HOSPITAL_COMMUNITY)
Admission: RE | Admit: 2022-07-21 | Discharge: 2022-07-21 | Disposition: A | Discharge status: Home / Self Care | Payer: 59 | Source: Ambulatory Visit | Attending: Hematology & Oncology | Admitting: Hematology & Oncology

## 2022-07-21 DIAGNOSIS — C61 Malignant neoplasm of prostate: Secondary | ICD-10-CM | POA: Diagnosis present

## 2022-07-21 LAB — CBC
HCT: 30.4 % — ABNORMAL LOW (ref 39.0–52.0)
Hemoglobin: 9.7 g/dL — ABNORMAL LOW (ref 13.0–17.0)
MCH: 28.6 pg (ref 26.0–34.0)
MCHC: 31.9 g/dL (ref 30.0–36.0)
MCV: 89.7 fL (ref 80.0–100.0)
Platelets: 192 10*3/uL (ref 150–400)
RBC: 3.39 MIL/uL — ABNORMAL LOW (ref 4.22–5.81)
RDW: 15.5 % (ref 11.5–15.5)
WBC: 3.8 10*3/uL — ABNORMAL LOW (ref 4.0–10.5)
nRBC: 0 % (ref 0.0–0.2)

## 2022-07-21 LAB — COMPREHENSIVE METABOLIC PANEL
ALT: 10 U/L (ref 0–44)
AST: 24 U/L (ref 15–41)
Albumin: 3.3 g/dL — ABNORMAL LOW (ref 3.5–5.0)
Alkaline Phosphatase: 76 U/L (ref 38–126)
Anion gap: 8 (ref 5–15)
BUN: 14 mg/dL (ref 8–23)
CO2: 23 mmol/L (ref 22–32)
Calcium: 8.1 mg/dL — ABNORMAL LOW (ref 8.9–10.3)
Chloride: 110 mmol/L (ref 98–111)
Creatinine, Ser: 1.59 mg/dL — ABNORMAL HIGH (ref 0.61–1.24)
GFR, Estimated: 46 mL/min — ABNORMAL LOW (ref >60)
Glucose, Bld: 126 mg/dL — ABNORMAL HIGH (ref 70–99)
Potassium: 4 mmol/L (ref 3.5–5.1)
Sodium: 141 mmol/L (ref 135–145)
Total Bilirubin: 0.3 mg/dL (ref 0.3–1.2)
Total Protein: 6.8 g/dL (ref 6.5–8.1)

## 2022-07-21 MED ORDER — SODIUM CHLORIDE 0.9 % IV BOLUS: 1000.0000 mL | Freq: Once | INTRAVENOUS | Status: DC

## 2022-07-21 MED ORDER — LUTETIUM LU 177 VIPIVOTIDE TET 1000 MBQ/ML IV SOLN
197.9500 | Freq: Once | INTRAVENOUS | Status: AC
  Administered 2022-07-21: 197.95 via INTRAVENOUS

## 2022-07-21 NOTE — Progress Notes (Signed)
CLINICAL DATA: [ : Castrate resistant metastatic prostate carcinoma. Patient status post chemotherapy with increasing PSA. Bilateral renal stents.]  Multifocal bone metastasis.   EXAM: NUCLEAR MEDICINE PLUVICTO INJECTION  TECHNIQUE: Infusion: The nuclear medicine technologist and I personally verified the dose activity to be delivered as specified in the written directive, and verified the patient identification via 2 separate methods.  Initial flush of the intravenous catheter was performed was sterile saline. The dose syringe was connected to the catheter and the Lu-177 Pluvicto administered over a 1 to 10 min infusion. Single 10 cc  lushes with normal saline follow the dose. No complications were noted. The entire IV tubing, venocatheter, stopcock and syringes was removed in total, placed in a disposal bag and sent for assay of the residual activity, which will be reported at a later time in our EMR by the physics staff. Pressure was applied to the venipuncture site, and a compression bandage placed. Patient monitored for 1 hour following infusion.    Radiation Safety personnel were present to perform the discharge survey, as detailed on their documentation. After a short period of observation, the patient had his IV removed.  RADIOPHARMACEUTICALS: [198] mCi Lu-177 PLUVICTO  FINDINGS: Current Infusion: [5  Planned Infusions: 6  Patient presented to nuclear medicine for treatment. The patient's most recent blood counts were reviewed and remains a adequate candidate to proceed with Lu-177 Pluvicto.   Patient reports no adverse effects other than generalized fatigue.   Patient's creatinine remains elevated at 1.59 compared to 1.66 one  month prior.  Patient encouraged to hydrate.  Renal stents acknowledged.   Some evidence of mild myelosuppression:   Hemoglobin decreased to 9.7  decreased from 12.9 three months prior.   Platelets normal at 192K but decreased from 243 K.   The  patient was situated in an infusion suite with a contact barrier placed under the arm. Intravenous access was established, using sterile technique, and a normal saline infusion from a syringe was started.   Patient encouraged to follow up with oncologist is 5 week for pre-therapy labs (CBC, CMP, and PSA).  IMPRESSION: Current Infusion: [5]  Planned Infusions: 6    [The patient tolerated the infusion well. The patient will return in 6 weeks  for ongoing care.]

## 2022-08-23 ENCOUNTER — Inpatient Hospital Stay: Payer: 59 | Attending: Internal Medicine

## 2022-08-27 NOTE — Written Directive (Addendum)
  PLUVICTO  THERAPY   RADIOPHARMACEUTICAL: Lutetium 177 vipivotide tetraxetan (Pluvicto)     PRESCRIBED DOSE FOR ADMINISTRATION:  200 mCi   ROUTE OFADMINISTRATION:  IV   DIAGNOSIS:  Prostate cancer   REFERRING PHYSICIAN: Harish    TREATMENT #: 6   ADDITIONAL PHYSICIAN COMMENTS/NOTES:   AUTHORIZED USER SIGNATURE & TIME STAMP: Patriciaann Clan, MD   08/27/22    6:43 PM

## 2022-09-01 ENCOUNTER — Ambulatory Visit (HOSPITAL_COMMUNITY)
Admission: RE | Admit: 2022-09-01 | Discharge: 2022-09-01 | Disposition: A | Discharge status: Home / Self Care | Payer: 59 | Source: Ambulatory Visit | Attending: Hematology & Oncology | Admitting: Hematology & Oncology

## 2022-09-01 DIAGNOSIS — C61 Malignant neoplasm of prostate: Secondary | ICD-10-CM | POA: Insufficient documentation

## 2022-09-01 LAB — COMPREHENSIVE METABOLIC PANEL
ALT: 75 U/L — ABNORMAL HIGH (ref 0–44)
AST: 117 U/L — ABNORMAL HIGH (ref 15–41)
Albumin: 3.6 g/dL (ref 3.5–5.0)
Alkaline Phosphatase: 83 U/L (ref 38–126)
Anion gap: 12 (ref 5–15)
BUN: 16 mg/dL (ref 8–23)
CO2: 22 mmol/L (ref 22–32)
Calcium: 8.4 mg/dL — ABNORMAL LOW (ref 8.9–10.3)
Chloride: 106 mmol/L (ref 98–111)
Creatinine, Ser: 1.62 mg/dL — ABNORMAL HIGH (ref 0.61–1.24)
GFR, Estimated: 45 mL/min — ABNORMAL LOW (ref >60)
Glucose, Bld: 105 mg/dL — ABNORMAL HIGH (ref 70–99)
Potassium: 3.7 mmol/L (ref 3.5–5.1)
Sodium: 140 mmol/L (ref 135–145)
Total Bilirubin: 0.9 mg/dL (ref 0.3–1.2)
Total Protein: 6.5 g/dL (ref 6.5–8.1)

## 2022-09-01 LAB — PSA: Prostatic Specific Antigen: 14.53 ng/mL — ABNORMAL HIGH (ref 0.00–4.00)

## 2022-09-01 LAB — CBC
HCT: 31 % — ABNORMAL LOW (ref 39.0–52.0)
Hemoglobin: 10 g/dL — ABNORMAL LOW (ref 13.0–17.0)
MCH: 29.5 pg (ref 26.0–34.0)
MCHC: 32.3 g/dL (ref 30.0–36.0)
MCV: 91.4 fL (ref 80.0–100.0)
Platelets: 106 10*3/uL — ABNORMAL LOW (ref 150–400)
RBC: 3.39 MIL/uL — ABNORMAL LOW (ref 4.22–5.81)
RDW: 15.4 % (ref 11.5–15.5)
WBC: 2.8 10*3/uL — ABNORMAL LOW (ref 4.0–10.5)
nRBC: 0 % (ref 0.0–0.2)

## 2022-09-01 MED ORDER — LUTETIUM LU 177 VIPIVOTIDE TET 1000 MBQ/ML IV SOLN
202.0000 | Freq: Once | INTRAVENOUS | Status: AC
  Administered 2022-09-01: 202 via INTRAVENOUS

## 2022-09-01 MED ORDER — SODIUM CHLORIDE 0.9 % IV SOLN: INTRAVENOUS | Status: AC

## 2022-09-01 MED ORDER — SODIUM CHLORIDE 0.9 % IV BOLUS: 1000.0000 mL | Freq: Once | INTRAVENOUS | Status: DC

## 2022-09-01 NOTE — Progress Notes (Addendum)
CLINICAL DATA: [72 year old male castrate resistant prostate adenocarcinoma.  Advanced bone metastasis on PSMA PET scan 12/08/2021.]  EXAM: NUCLEAR MEDICINE PLUVICTO INJECTION  TECHNIQUE: Infusion: The nuclear medicine technologist and I personally verified the dose activity to be delivered as specified in the written directive, and verified the patient identification via 2 separate methods.  Initial flush of the intravenous catheter was performed was sterile saline. The dose syringe was connected to the catheter and the Lu-177 Pluvicto administered over a 1 to 10 min infusion. Single 10 cc  lushes with normal saline follow the dose. No complications were noted. The entire IV tubing, venocatheter, stopcock and syringes was removed in total, placed in a disposal bag and sent for assay of the residual activity, which will be reported at a later time in our EMR by the physics staff. Pressure was applied to the venipuncture site, and a compression bandage placed. Patient monitored for 1 hour following infusion.    Radiation Safety personnel were present to perform the discharge survey, as detailed on their documentation. After a short period of observation, the patient had his IV removed.  RADIOPHARMACEUTICALS: [Two hundred two] microcuries Lu-177 PLUVICTO  FINDINGS: Current Infusion: [6]  Planned Infusions: 6   Patient presented to nuclear medicine for treatment. The patient's most recent blood counts were reviewed and remains a good candidate to proceed with Lu-177 Pluvicto.    Patient reports no adverse effects.  Patient doing well clinically    PSA level stable at 14.   Renal insufficiency stable with creatinine equal 1.6.  Patient with indwelling ureteral stents.  Patient advised to hydrate well following therapy.   Anemia stable at hemoglobin 10.0.   Platelets decreased to 106 from 192.    The patient was situated in an infusion suite with a contact barrier placed under the  arm. Intravenous access was established, using sterile technique, and a normal saline infusion from a syringe was started.     Micro-dosimetry: The prescribed radiation activity was assayed and confirmed to be within specified tolerance.  IMPRESSION: Current Infusion: [6]  Planned Infusions: 6  [The patient tolerated the final infusion well.  Consider follow-up PSMA PET scan.

## 2022-12-27 IMAGING — CT CT ANGIO CHEST
2 of 8 series · 19 of 36 positions shown · IV contrast (Omnipaque)
Comparison: None.

CLINICAL DATA: Near syncope, atrial fibrillation

EXAM:
CT ANGIOGRAPHY CHEST WITH CONTRAST
TECHNIQUE: Multidetector CT imaging of the chest was performed using the
standard protocol during bolus administration of intravenous
contrast. Multiplanar CT image reconstructions and MIPs were
obtained to evaluate the vascular anatomy.
CONTRAST:  100mL OMNIPAQUE IOHEXOL 350 MG/ML SOLN

[Series 6: pe coronal mpr · coronal · 0.67mm/px · 1 of 113 slices shown]
[im 57/113  mediastinal]
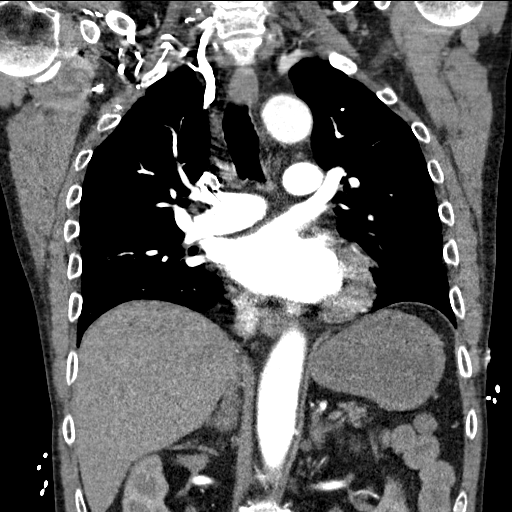

[Series 10: pe thins · axial · 0.71mm/px · z∈[+1022,+1328]mm · 18 of 342 slices shown]
[im 18/342  lung]
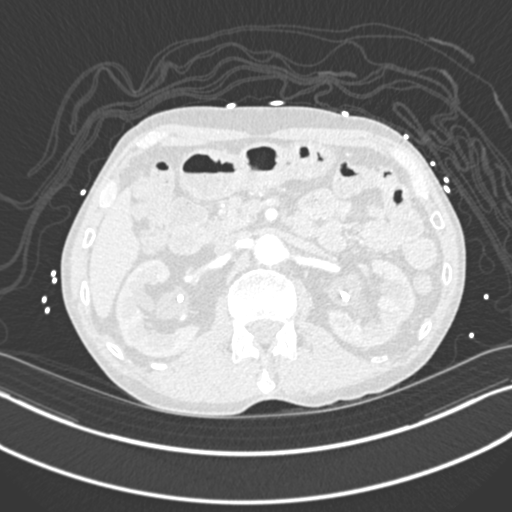
[im 36/342  mediastinal]
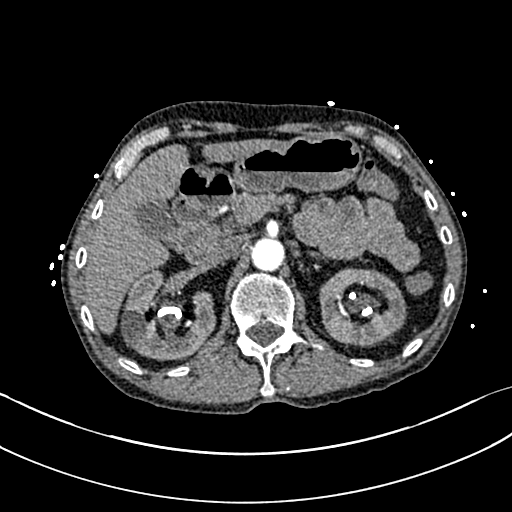
[im 54/342  lung]
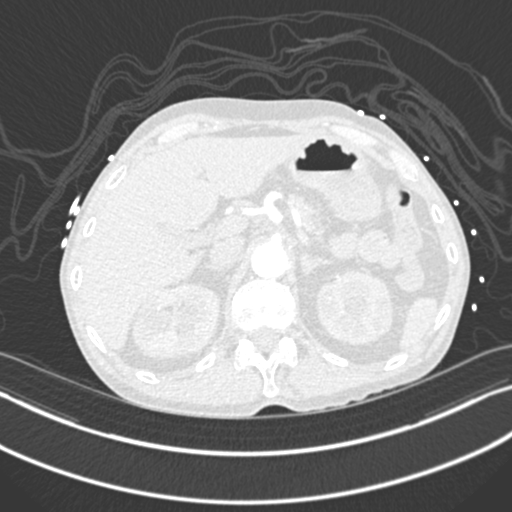
[im 72/342  mediastinal]
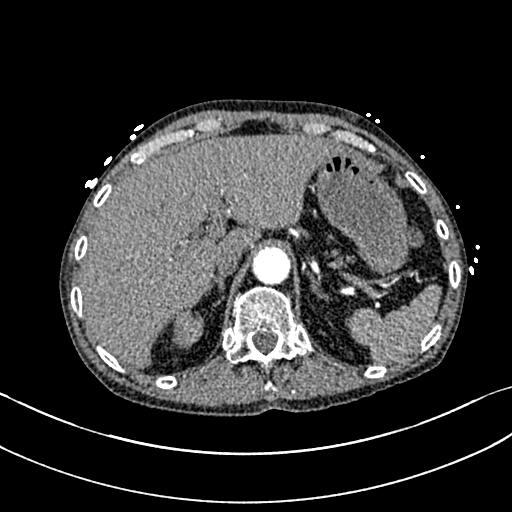
[im 90/342  lung]
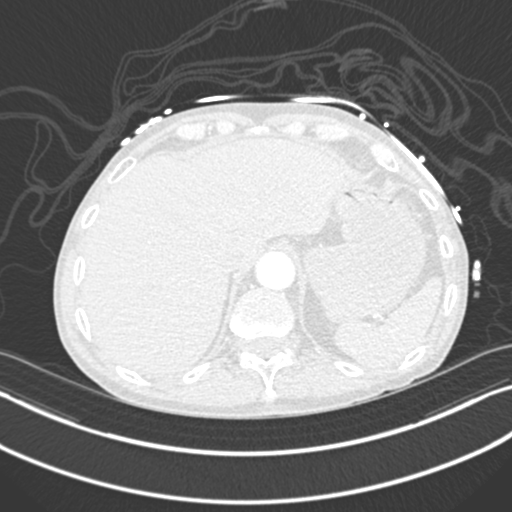
[im 108/342  mediastinal]
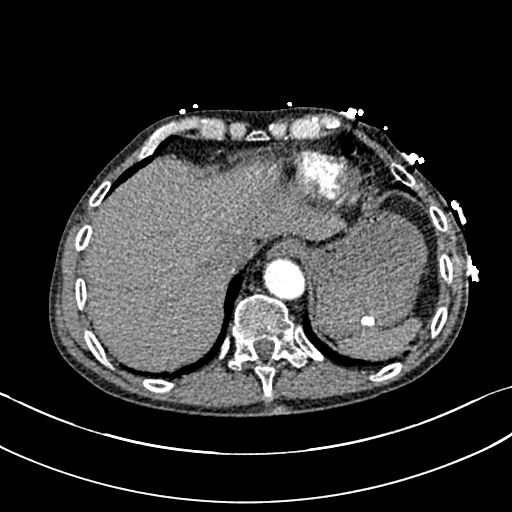
[im 126/342  lung]
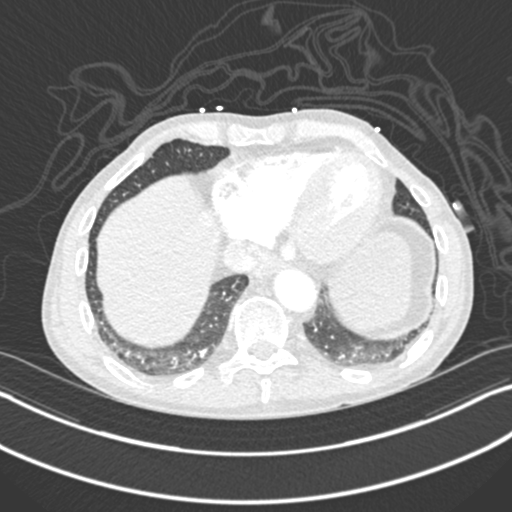
[im 144/342  mediastinal]
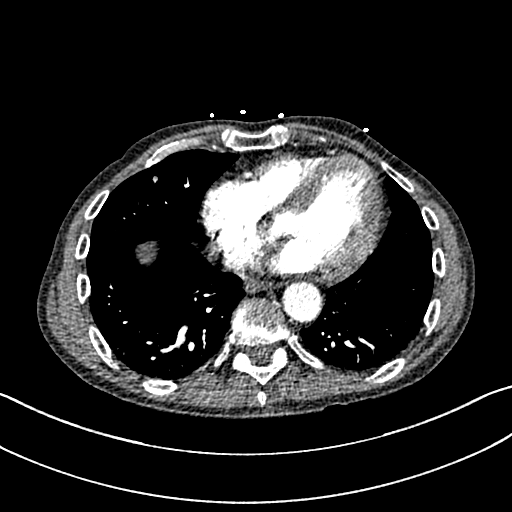
[im 162/342  lung]
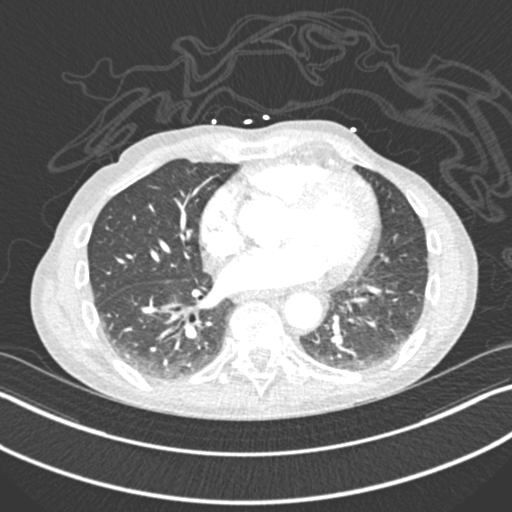
[im 180/342  mediastinal]
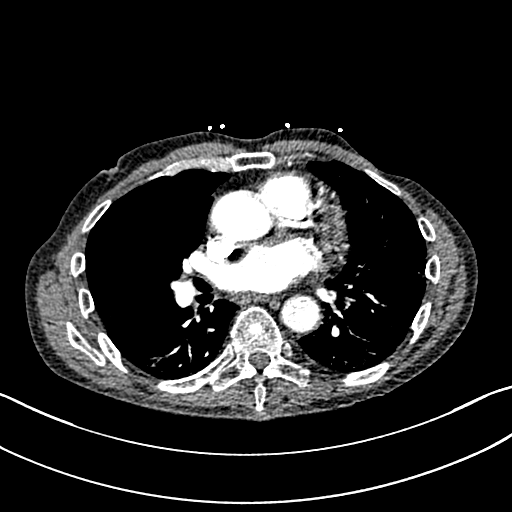
[im 198/342  lung]
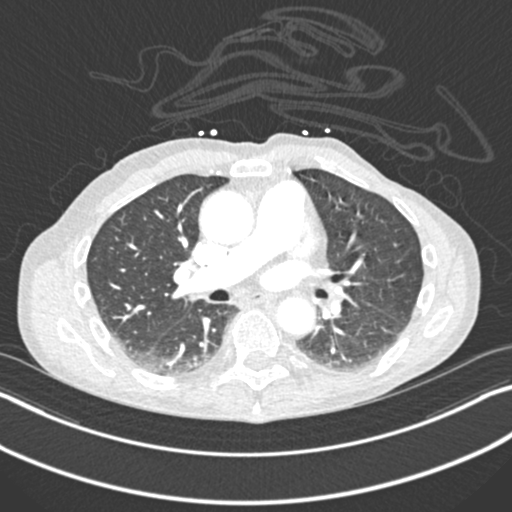
[im 216/342  mediastinal]
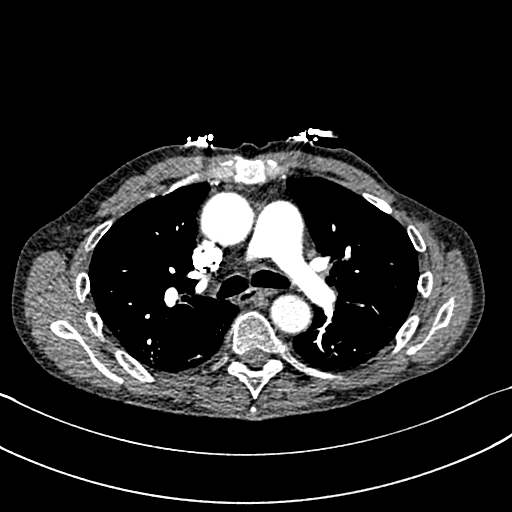
[im 234/342  lung]
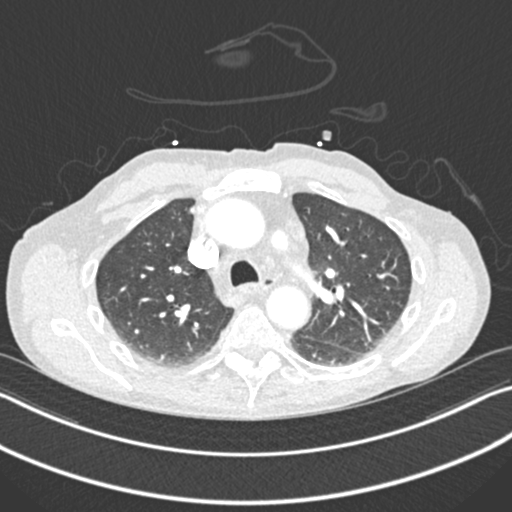
[im 252/342  mediastinal]
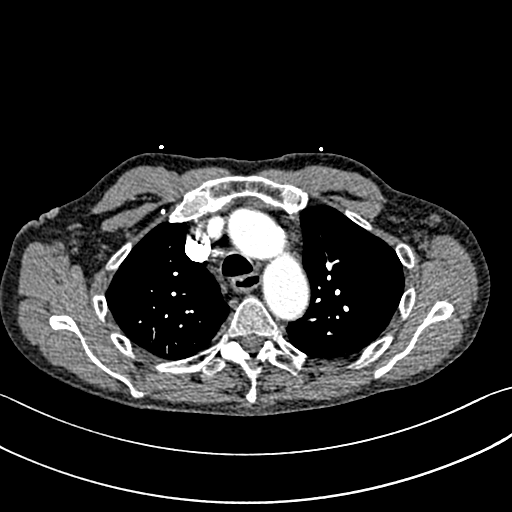
[im 270/342  lung]
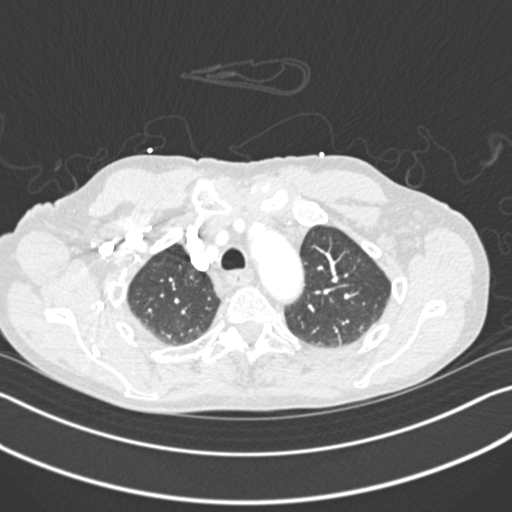
[im 288/342  mediastinal]
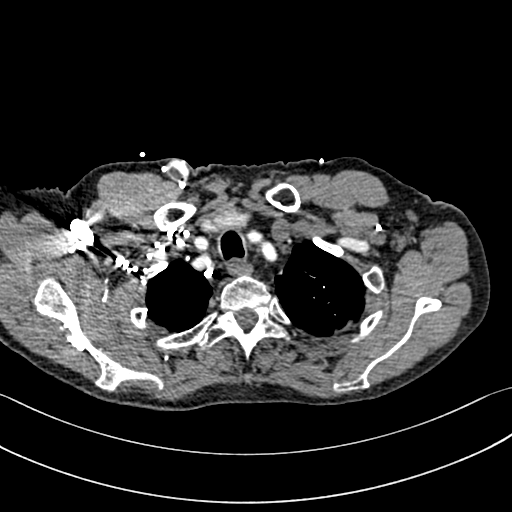
[im 306/342  lung]
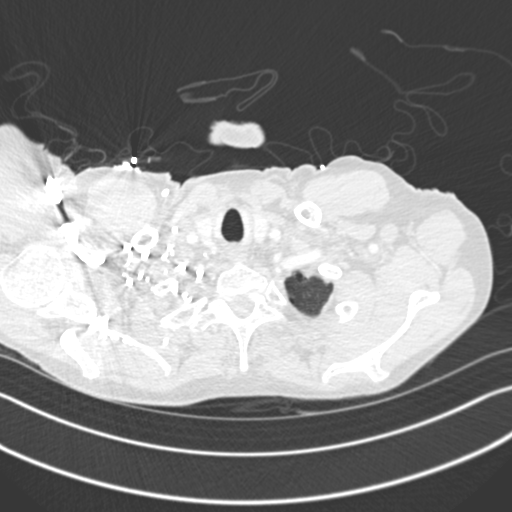
[im 324/342  mediastinal]
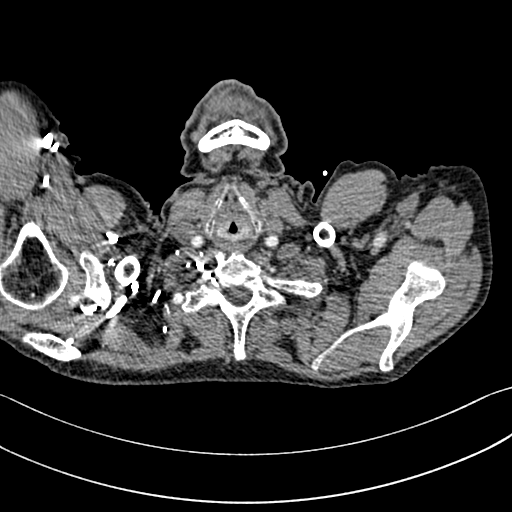

[19 of 36 positions shown; findings below may reference images not displayed]

FINDINGS: Cardiovascular: Heart is enlarged in size. There is homogeneous
enhancement in the thoracic aorta. Ascending thoracic aorta measures
4 cm. There are no intraluminal filling defects in pulmonary artery
branches. There is ectasia of main pulmonary artery measuring
cm.

Mediastinum/Nodes: No significant lymphadenopathy seen

Lungs/Pleura: There is no focal pulmonary consolidation. There is no
pleural effusion or pneumothorax.

Upper Abdomen: There is bilateral hydronephrosis. Bilateral ureteral
stents are seen.

Musculoskeletal: Is deformity in inferior aspect of manubrium
sternum. There is slight decrease in height of few thoracic
vertebral bodies. There is decrease in height of upper endplate of
body of T2 vertebra. There is mild central compression in the bodies
of T6, T7, T8, T9 and T10 vertebrae. Degenerative changes are noted
in the visualized lower cervical spine with bony spurs.

Review of the MIP images confirms the above findings.
IMPRESSION: There is no evidence of pulmonary artery embolism. There is no
evidence of thoracic aortic dissection. There is no focal pulmonary
consolidation.

There is ectasia of main pulmonary artery suggesting possible
pulmonary arterial hypertension. There is ectasia of ascending
thoracic aorta.

There is deformity in the inferior aspect of manubrium sternum
suggesting recent or old fracture. There is decrease in height of
multiple thoracic vertebral bodies suggesting recent or old
fractures.
